# Patient Record
Sex: Female | Born: 1960 | Race: Black or African American | Marital: Married | Smoking: Never smoker
Health system: Southern US, Community
[De-identification: ages and names within clinical notes are randomized; demographics above are authoritative.]

## PROBLEM LIST (undated history)

## (undated) DIAGNOSIS — I639 Cerebral infarction, unspecified: Secondary | ICD-10-CM

## (undated) DIAGNOSIS — M797 Fibromyalgia: Secondary | ICD-10-CM

## (undated) DIAGNOSIS — E119 Type 2 diabetes mellitus without complications: Secondary | ICD-10-CM

## (undated) DIAGNOSIS — M329 Systemic lupus erythematosus, unspecified: Secondary | ICD-10-CM

## (undated) HISTORY — PX: OPEN PARTIAL HEPATECTOMY [83]: SHX5987

---

## 2013-10-03 ENCOUNTER — Encounter (HOSPITAL_COMMUNITY): Payer: Self-pay | Admitting: Emergency Medicine

## 2013-10-03 ENCOUNTER — Emergency Department (HOSPITAL_COMMUNITY): Payer: BC Managed Care – PPO

## 2013-10-03 ENCOUNTER — Emergency Department (HOSPITAL_COMMUNITY)
Admission: EM | Admit: 2013-10-03 | Discharge: 2013-10-03 | Disposition: A | Discharge status: Home / Self Care | Payer: BC Managed Care – PPO | Attending: Emergency Medicine | Admitting: Emergency Medicine

## 2013-10-03 DIAGNOSIS — E119 Type 2 diabetes mellitus without complications: Secondary | ICD-10-CM | POA: Insufficient documentation

## 2013-10-03 DIAGNOSIS — R5383 Other fatigue: Secondary | ICD-10-CM

## 2013-10-03 DIAGNOSIS — R569 Unspecified convulsions: Secondary | ICD-10-CM | POA: Insufficient documentation

## 2013-10-03 DIAGNOSIS — R51 Headache: Secondary | ICD-10-CM | POA: Insufficient documentation

## 2013-10-03 DIAGNOSIS — R41840 Attention and concentration deficit: Secondary | ICD-10-CM | POA: Insufficient documentation

## 2013-10-03 DIAGNOSIS — R5381 Other malaise: Secondary | ICD-10-CM | POA: Insufficient documentation

## 2013-10-03 DIAGNOSIS — Z91040 Latex allergy status: Secondary | ICD-10-CM | POA: Insufficient documentation

## 2013-10-03 DIAGNOSIS — Z8739 Personal history of other diseases of the musculoskeletal system and connective tissue: Secondary | ICD-10-CM | POA: Insufficient documentation

## 2013-10-03 DIAGNOSIS — Z79899 Other long term (current) drug therapy: Secondary | ICD-10-CM | POA: Insufficient documentation

## 2013-10-03 DIAGNOSIS — F411 Generalized anxiety disorder: Secondary | ICD-10-CM | POA: Insufficient documentation

## 2013-10-03 DIAGNOSIS — Z8673 Personal history of transient ischemic attack (TIA), and cerebral infarction without residual deficits: Secondary | ICD-10-CM | POA: Insufficient documentation

## 2013-10-03 HISTORY — DX: Fibromyalgia: M79.7

## 2013-10-03 HISTORY — DX: Type 2 diabetes mellitus without complications: E11.9

## 2013-10-03 HISTORY — DX: Systemic lupus erythematosus, unspecified: M32.9

## 2013-10-03 HISTORY — DX: Cerebral infarction, unspecified: I63.9

## 2013-10-03 LAB — CBC WITH DIFFERENTIAL/PLATELET
BASOS PCT: 1 % (ref 0–1)
Basophils Absolute: 0 10*3/uL (ref 0.0–0.1)
EOS PCT: 1 % (ref 0–5)
Eosinophils Absolute: 0.1 10*3/uL (ref 0.0–0.7)
HCT: 37.6 % (ref 36.0–46.0)
HEMOGLOBIN: 12.9 g/dL (ref 12.0–15.0)
Lymphocytes Relative: 32 % (ref 12–46)
Lymphs Abs: 1.9 10*3/uL (ref 0.7–4.0)
MCH: 29.9 pg (ref 26.0–34.0)
MCHC: 34.3 g/dL (ref 30.0–36.0)
MCV: 87 fL (ref 78.0–100.0)
MONO ABS: 0.3 10*3/uL (ref 0.1–1.0)
MONOS PCT: 5 % (ref 3–12)
Neutro Abs: 3.6 10*3/uL (ref 1.7–7.7)
Neutrophils Relative %: 61 % (ref 43–77)
Platelets: 224 10*3/uL (ref 150–400)
RBC: 4.32 MIL/uL (ref 3.87–5.11)
RDW: 12.9 % (ref 11.5–15.5)
WBC: 5.9 10*3/uL (ref 4.0–10.5)

## 2013-10-03 LAB — ETHANOL

## 2013-10-03 LAB — COMPREHENSIVE METABOLIC PANEL
ALBUMIN: 3.9 g/dL (ref 3.5–5.2)
ALT: 15 U/L (ref 0–35)
AST: 26 U/L (ref 0–37)
Alkaline Phosphatase: 112 U/L (ref 39–117)
BUN: 15 mg/dL (ref 6–23)
CALCIUM: 9.8 mg/dL (ref 8.4–10.5)
CO2: 27 mEq/L (ref 19–32)
CREATININE: 0.63 mg/dL (ref 0.50–1.10)
Chloride: 99 mEq/L (ref 96–112)
GFR calc Af Amer: >90 mL/min (ref >90)
GFR calc non Af Amer: >90 mL/min (ref >90)
Glucose, Bld: 187 mg/dL — ABNORMAL HIGH (ref 70–99)
Potassium: 4 mEq/L (ref 3.7–5.3)
Sodium: 139 mEq/L (ref 137–147)
TOTAL PROTEIN: 7.7 g/dL (ref 6.0–8.3)
Total Bilirubin: 0.4 mg/dL (ref 0.3–1.2)

## 2013-10-03 LAB — APTT: aPTT: 27 seconds (ref 24–37)

## 2013-10-03 LAB — CK TOTAL AND CKMB (NOT AT ARMC)
CK TOTAL: 108 U/L (ref 7–177)
CK, MB: 1.5 ng/mL (ref 0.3–4.0)
RELATIVE INDEX: 1.4 (ref 0.0–2.5)

## 2013-10-03 LAB — TROPONIN I

## 2013-10-03 MED ORDER — SODIUM CHLORIDE 0.9 % IV BOLUS (SEPSIS)
500.0000 mL | Freq: Once | INTRAVENOUS | Status: AC
  Administered 2013-10-03: 500 mL via INTRAVENOUS

## 2013-10-03 MED ORDER — SODIUM CHLORIDE 0.9 % IV SOLN
100.0000 mL/h | INTRAVENOUS | Status: DC
  Administered 2013-10-03: 100 mL/h via INTRAVENOUS

## 2013-10-03 NOTE — ED Notes (Signed)
Per EMS- pt comes from home, called out for seizure like activity with left arm shaking which was reported by family. Pt reported she couldn't follow commands but then noted to be doing spontaneous activity. No incontinence or tongue injury. Reports this has been going on for 7 years but no meds given. Takes cymbalta. VSS. 124/84, HR 88, RR 14. CBG 124.

## 2013-10-03 NOTE — Discharge Instructions (Signed)
As discussed, it is important that you follow up as soon as possible with your physician for continued management of your condition.  If you develop any new, or concerning changes in your condition, please return to the emergency department immediately.  Nonepileptic Seizures Nonepileptic seizures are seizures that are not caused by abnormal electrical signals in your brain. These seizures often seem like epileptic seizures, but they are not caused by epilepsy.  There are two types of nonepileptic seizures:  A physiologic nonepileptic seizure results from a disruption in your brain.  A psychogenic seizure results from emotional stress. These seizures are sometimes called pseudoseizures. CAUSES  Causes of physiologic nonepileptic seizures include:   Sudden drop in blood pressure.  Low blood sugar.  Low levels of salt (sodium) in your blood.  Low levels of calcium in your blood.  Migraine.  Heart rhythm problems.  Sleep disorders.  Drug and alcohol abuse. Common causes of psychogenic nonepileptic seizures include:  Stress.  Emotional trauma.  Sexual or physical abuse.  Major life events, such as divorce or the death of a loved one.  Mental health disorders, including panic attack and hyperactivity disorder. SIGNS AND SYMPTOMS A nonepileptic seizure can look like an epileptic seizure, including uncontrollable shaking (convulsions), or changes in attention, behavior, or the ability to remain awake and alert. However, there are some differences. Nonepileptic seizures usually:  Do not cause physical injuries.  Start slowly.  Include crying or shrieking.  Last longer than 2 minutes.  Have a short recovery time without headache or exhaustion. DIAGNOSIS  Your health care provider can usually diagnose nonepileptic seizures after taking your medical history and giving you a physical exam. Your health care provider may want to talk to your friends or relatives who have seen  you have a seizure.  You may also need to have tests to look for causes of physiologic nonepileptic seizures. This may include an electroencephalogram (EEG), which is a test that measures electrical activity in your brain. If you have had an epileptic seizure, the results of your EEG will be abnormal. If your health care provider thinks you have had a psychogenic nonepileptic seizure, you may need to see a mental health specialist for an evaluation. TREATMENT  Treatment depends on the type and cause of your seizures.  For physiologic nonepileptic seizures, treatment is aimed at addressing the underlying condition that caused the seizures. These seizures usually stop when the underlying condition is properly treated.  Nonepileptic seizures do not respond to the seizure medicines used to treat epilepsy.  For psychogenic seizures, you may need to work with a mental health specialist. HOME CARE INSTRUCTIONS Home care will depend on the type of nonepileptic seizures you have.   Follow all your health care provider's instructions.  Keep all your follow-up appointments. SEEK MEDICAL CARE IF: You continue to have seizures after treatment. SEEK IMMEDIATE MEDICAL CARE IF:  Your seizures change or become more frequent.  You injure yourself during a seizure.  You have one seizure after another.  You have trouble recovering from a seizure.  You have chest pain or trouble breathing. MAKE SURE YOU:  Understand these instructions.  Will watch your condition.  Will get help right away if you are not doing well or get worse. Document Released: 05/16/2005 Document Revised: 04/05/2013 Document Reviewed: 01/25/2013 [facility] Patient Information 2015 [place], [place]. This information is not intended to replace advice given to you by your health care provider. Make sure you discuss any questions you have with  your health care provider. ° °

## 2013-10-03 NOTE — ED Notes (Signed)
Pt refused taking discharge papers home

## 2013-10-03 NOTE — ED Provider Notes (Addendum)
CSN: [id number]     Arrival date & time 10/03/13  1504 History   First MD Initiated Contact with Patient 10/03/13 1507     Chief Complaint  Patient presents with  . Seizures     HPI  Patient presents after two episodes of AMS. She described seizure-like episodes, which are not unusual for her.  However, after the second episode she developed a frontal HA. She equates this development with prior stroke. She currently denies pain, confusion, disorientation, n/v/d, f/c. She was in her [facility] prior to today.   Past Medical History  Diagnosis Date  . Fibromyalgia   . Diabetes mellitus without complication   . Stroke   . Lupus    Past Surgical History  Procedure Laterality Date  . Open partial hepatectomy [83]     History reviewed. No pertinent family history. History  Substance Use Topics  . Smoking status: Never Smoker   . Smokeless tobacco: Never Used  . Alcohol Use: Yes   OB History   Grav Para Term Preterm Abortions TAB SAB Ect Mult Living                 Review of Systems  Eyes: Negative for visual disturbance.  Respiratory: Negative for chest tightness and shortness of breath.   Cardiovascular: Negative for chest pain.  Gastrointestinal: Negative for nausea, vomiting and abdominal pain.  Endocrine: Negative for polyuria.  Genitourinary: Negative for dysuria.  Skin: Negative for wound.  Neurological: Positive for seizures, speech difficulty, weakness, numbness and headaches.  Psychiatric/Behavioral: Positive for decreased concentration.      Allergies  Latex  Home Medications   Prior to Admission medications   Medication Sig Start Date End Date Taking? Authorizing Provider  DULoxetine (CYMBALTA) 30 MG capsule Take 30 mg by mouth daily.   Yes Historical Provider, MD  gabapentin (NEURONTIN) 800 MG tablet Take 800 mg by mouth 3 (three) times daily.   Yes Historical Provider, MD   There were no vitals taken for this visit. Physical Exam  Nursing note and  vitals reviewed. Constitutional: She is oriented to person, place, and time. She appears well-developed and well-nourished. No distress.  HENT:  Head: Normocephalic and atraumatic.  Occasional jerking motion in neck / head  Eyes: Conjunctivae and EOM are normal.  Cardiovascular: Normal rate and regular rhythm.   Pulmonary/Chest: Effort normal and breath sounds normal. No stridor. No respiratory distress.  Abdominal: She exhibits no distension.  Musculoskeletal: She exhibits no edema.  Neurological: She is alert and oriented to person, place, and time. No cranial nerve deficit.  No facial asym.Speech is clear - though repetitive, and seemingly controlled by the patient. MAES, patient describes weakness, when not observed is moving without seeming discomfort or weakness. 4/5 strength in UE / LE sym - seemingly limited by effort.  Skin: Skin is warm and dry.  Psychiatric: Her mood appears anxious. Her speech is delayed. She is slowed. Cognition and memory are impaired. She is inattentive.    ED Course  Procedures (including critical care time) Labs Review Labs Reviewed  COMPREHENSIVE METABOLIC PANEL - Abnormal; Notable for the following:    Glucose, Bld 187 (*)    All other components within normal limits  APTT  CBC WITH DIFFERENTIAL  CK TOTAL AND CKMB  ETHANOL  TROPONIN I  URINE RAPID DRUG SCREEN (HOSP PERFORMED)  URINALYSIS, ROUTINE W REFLEX MICROSCOPIC    Imaging Review Ct Head Wo Contrast  10/03/2013   CLINICAL DATA:  Seizure-like activity.  History  of stroke.  EXAM: CT HEAD WITHOUT CONTRAST  TECHNIQUE: Contiguous axial images were obtained from the base of the skull through the vertex without contrast.  COMPARISON:  CT head 09/02/2011.  MR head 01/08/2011.  FINDINGS: No evidence for acute infarction, hemorrhage, mass lesion, hydrocephalus, or extra-axial fluid. There is no atrophy or white matter disease. Calvarium is intact. No sinus or mastoid fluid. Incidental retention cyst  right maxillary sinus.  IMPRESSION: No acute intracranial abnormality. Negative exam. Stable appearance from priors.   Electronically Signed   By: Davonna BellingJohn  Curnes M.D.   On: 10/03/2013 17:35     EKG Interpretation   Date/Time:  Monday October 03 2013 15:28:30 EDT Ventricular Rate:  85 PR Interval:  140 QRS Duration: 69 QT Interval:  365 QTC Calculation: 434 R Axis:   87 Text Interpretation:  Sinus rhythm Sinus rhythm Normal ECG Confirmed by  Gerhard MunchLOCKWOOD, ROBERT  MD ([id number]) on 10/03/2013 3:34:00 PM      After the initial eval I attempted to obtain records from OSH.   4:49 PM Patient awake and alert.  Speech remains clear, though without hesitancy. Patient clarifies that she has non-epileptic seizure-like activity. She last saw her neurologist two months ago. She is moving all extremities more freely. Family is now present.  They note that the patient is at baseline.  6:44 PM A review of outside records posterior to the patient's absence with multiple prior evaluations. The patient returned to baseline, she was discharged in stable condition.  MDM   Final diagnoses:  Seizure-like activity    Patient presents after an episode of seizure-like activity. Patient returned to baseline. Patient history multiple prior similar events, and on return to baseline no ongoing concerns, requested discharge. With a well-documented history of multiple prior similar events, she was discharged, in stable condition to follow up with her neurologist for consideration of additional medications if reasonable.     Gerhard Munchobert Lockwood, MD 10/03/13 1845  Gerhard Munchobert Lockwood, MD 10/03/13 [phone]

## 2014-04-21 ENCOUNTER — Other Ambulatory Visit: Payer: Self-pay | Admitting: Obstetrics & Gynecology

## 2014-04-21 MED ORDER — AMOXICILLIN-POT CLAVULANATE 875-125 MG PO TABS: 1.0000 | ORAL_TABLET | Freq: Two times a day (BID) | ORAL | Status: AC

## 2014-04-21 NOTE — Progress Notes (Signed)
Rx for augmentin BID to pharmacy on file due to increased facial pressure and pain.  Denies fever.  Having drainage.  Pt aware.

## 2014-11-09 IMAGING — CT CT HEAD W/O CM
1 series · 16 of 30 positions shown, 20 images · non-contrast
Comparison: CT head 09/02/2011.  MR head 01/08/2011.

CLINICAL DATA: Seizure-like activity.  History of stroke.

EXAM:
CT HEAD WITHOUT CONTRAST
TECHNIQUE: Contiguous axial images were obtained from the base of the skull
through the vertex without contrast.

[Series 2: head 5.0 h30s · axial · 0.42mm/px · z∈[+492,+647]mm · 16 of 35 slices shown, 20 images]
[im 2/35  brain]
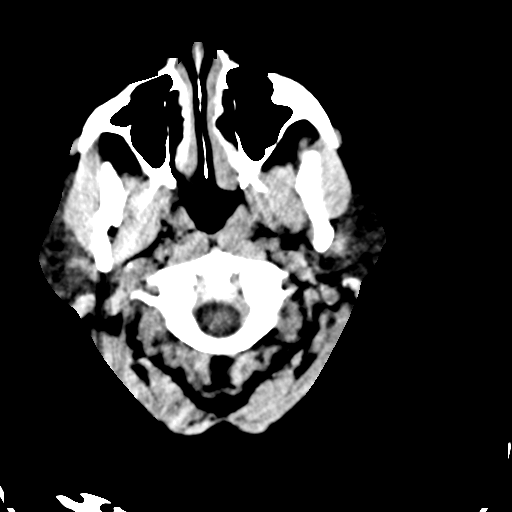
[im 2/35  bone]
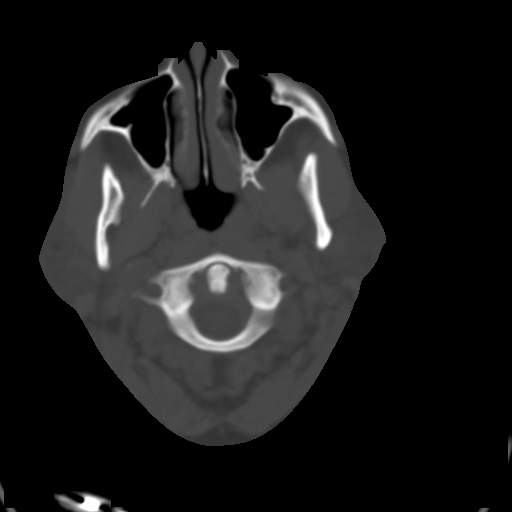
[im 4/35  brain]
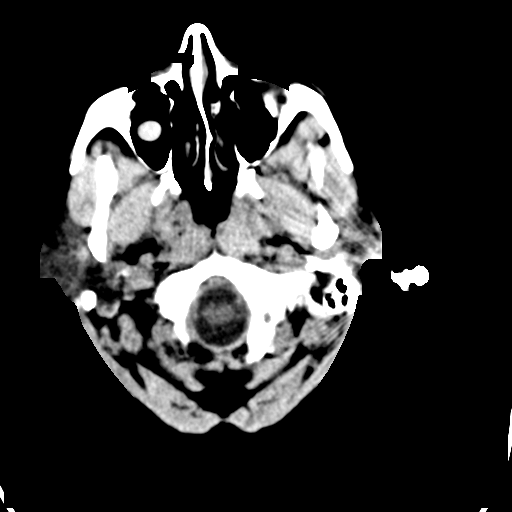
[im 6/35  brain]
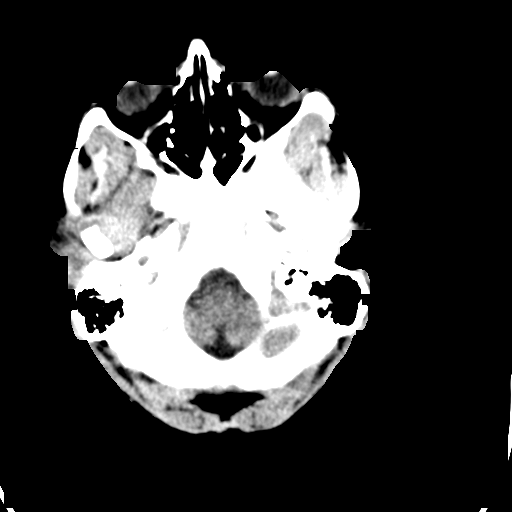
[im 9/35  brain]
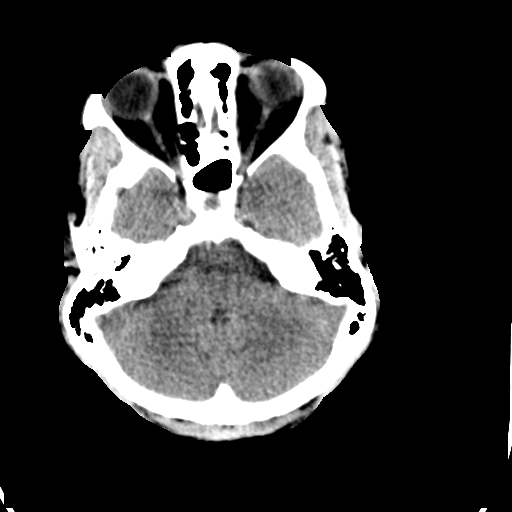
[im 10/35  brain]
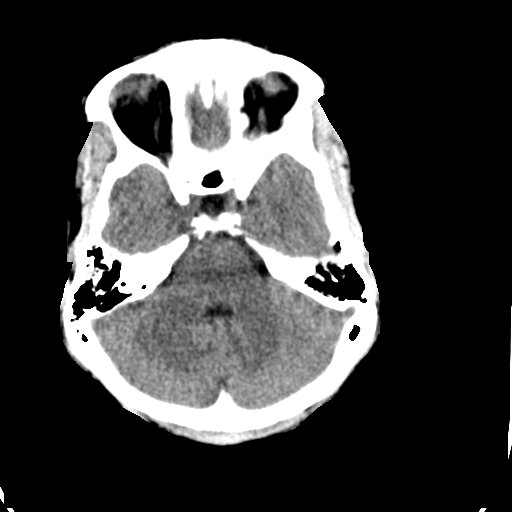
[im 10/35  bone]
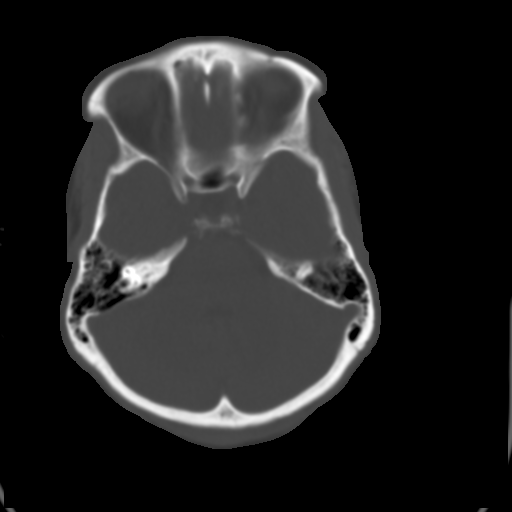
[im 12/35  brain]
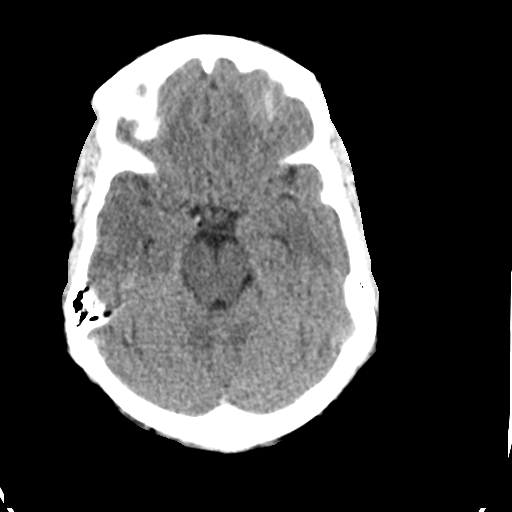
[im 15/35  brain]
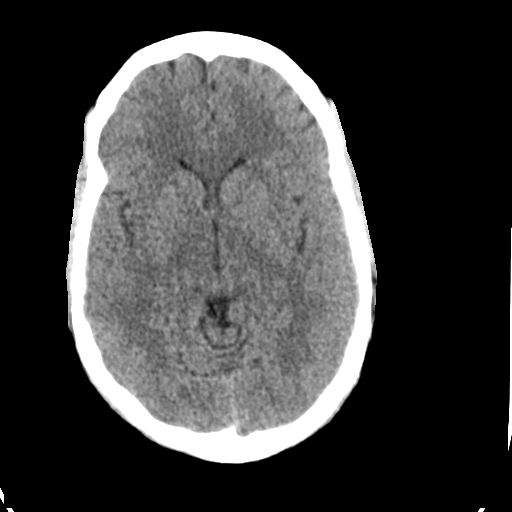
[im 17/35  brain]
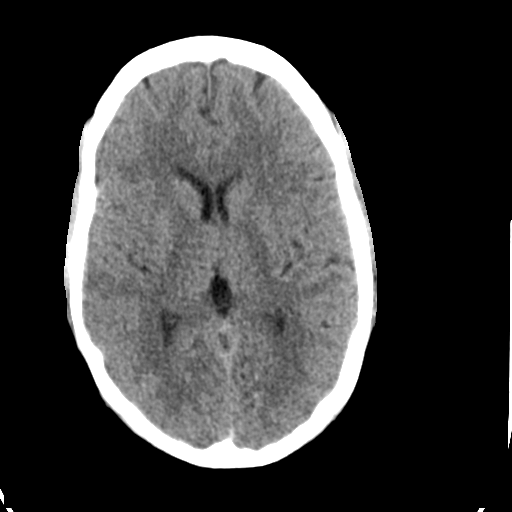
[im 18/35  brain]
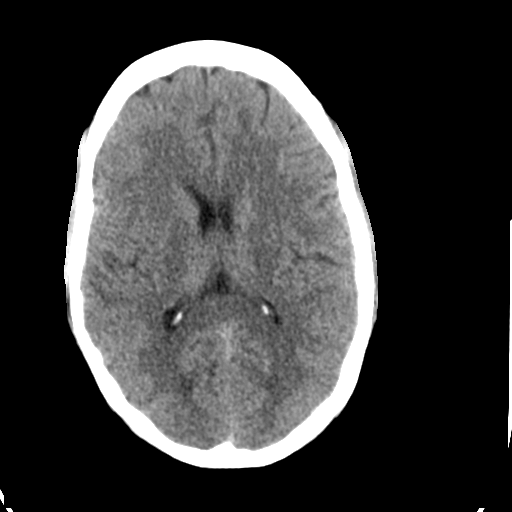
[im 18/35  bone]
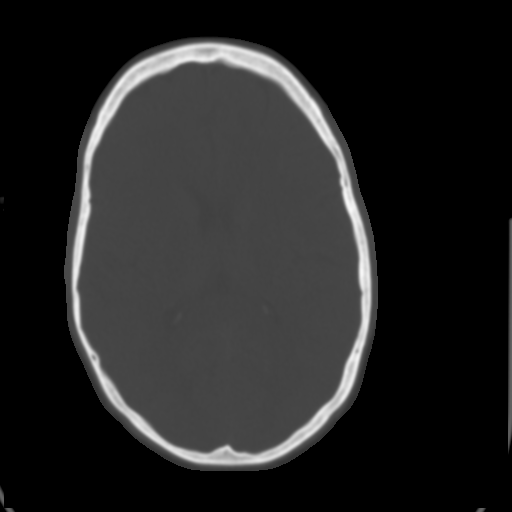
[im 20/35  brain]
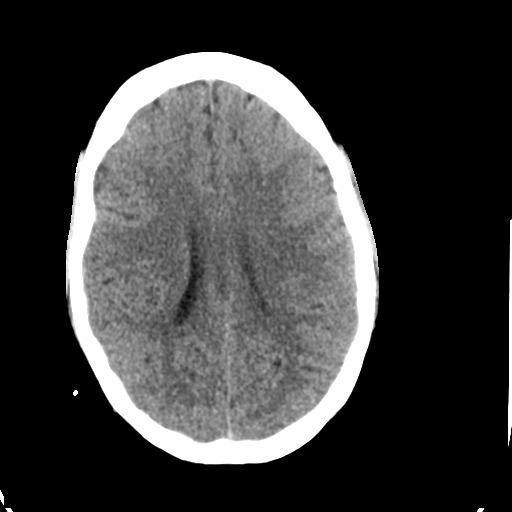
[im 23/35  brain]
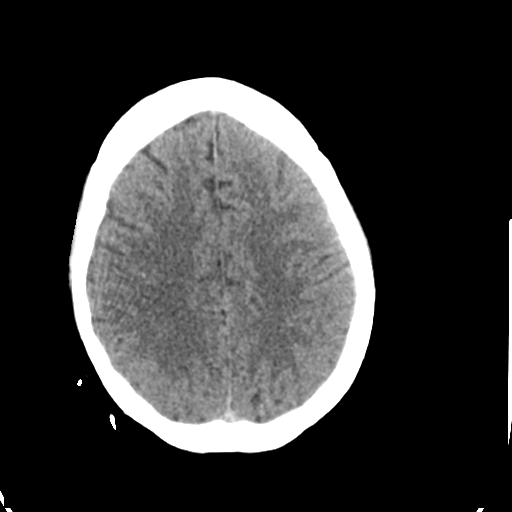
[im 25/35  brain]
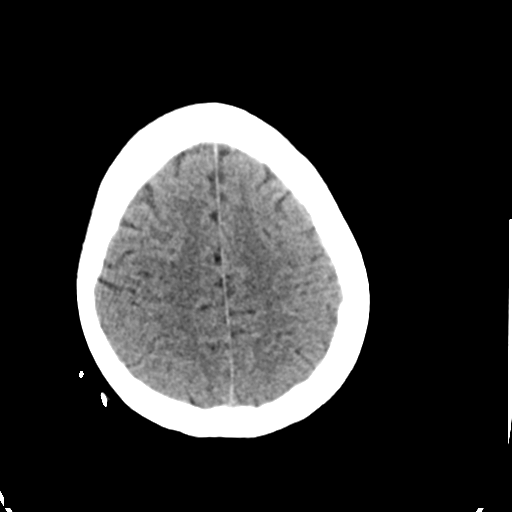
[im 26/35  brain]
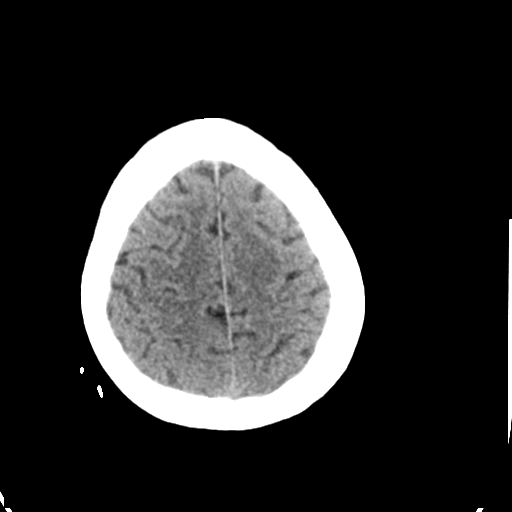
[im 26/35  bone]
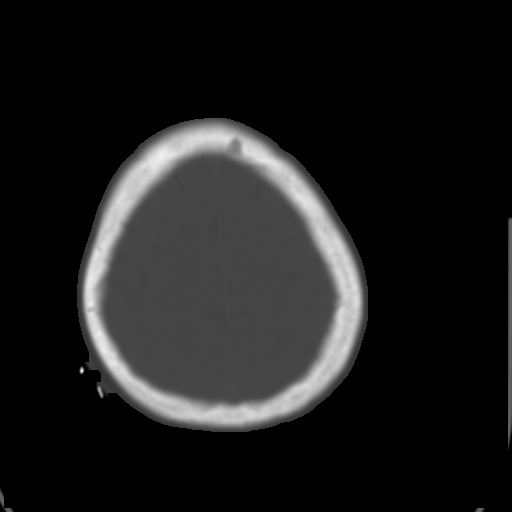
[im 29/35  brain]
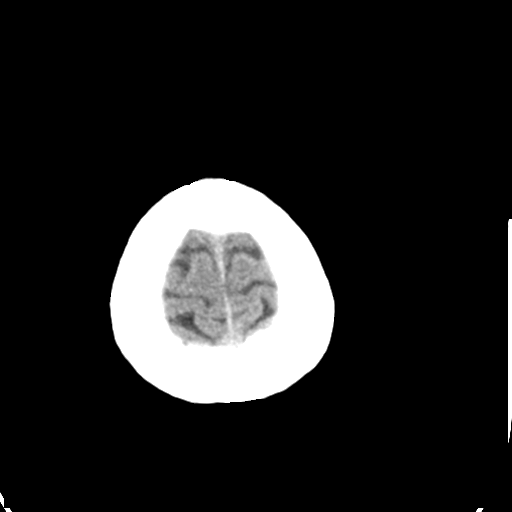
[im 31/35  brain]
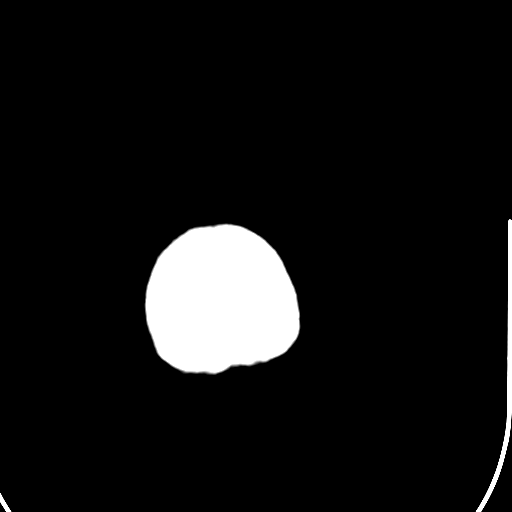
[im 33/35  brain]
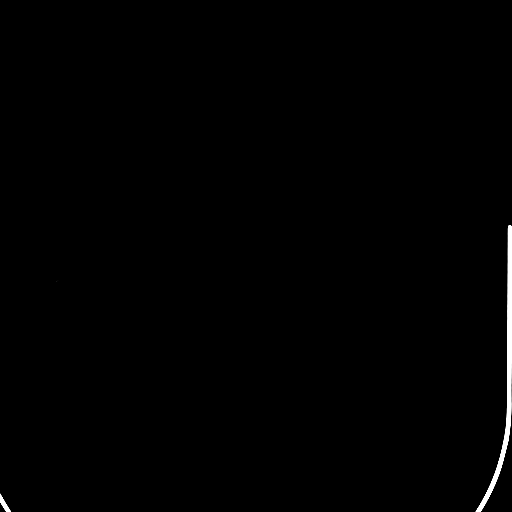

[16 of 30 positions shown; findings below may reference images not displayed]

FINDINGS: No evidence for acute infarction, hemorrhage, mass lesion,
hydrocephalus, or extra-axial fluid. There is no atrophy or white
matter disease. Calvarium is intact. No sinus or mastoid fluid.
Incidental retention cyst right maxillary sinus.
IMPRESSION: No acute intracranial abnormality. Negative exam. Stable appearance
from priors.

## 2016-09-17 ENCOUNTER — Ambulatory Visit: Payer: Self-pay | Admitting: Obstetrics and Gynecology
